# Patient Record
Sex: Male | Born: 1965 | Race: Black or African American | Hispanic: No | Marital: Single | Smoking: Never smoker
Health system: Southern US, Community
[De-identification: ages and names within clinical notes are randomized; demographics above are authoritative.]

## PROBLEM LIST (undated history)

## (undated) DIAGNOSIS — I10 Essential (primary) hypertension: Secondary | ICD-10-CM

## (undated) HISTORY — PX: HERNIA REPAIR: SHX51

---

## 2005-05-01 ENCOUNTER — Encounter: Admission: RE | Admit: 2005-05-01 | Discharge: 2005-05-01 | Payer: Self-pay | Admitting: Occupational Medicine

## 2015-08-18 ENCOUNTER — Encounter: Payer: Self-pay | Admitting: Physical Therapy

## 2015-08-18 ENCOUNTER — Ambulatory Visit: Payer: Self-pay | Attending: Chiropractic Medicine | Admitting: Physical Therapy

## 2015-08-18 DIAGNOSIS — M545 Low back pain, unspecified: Secondary | ICD-10-CM

## 2015-08-18 DIAGNOSIS — M542 Cervicalgia: Secondary | ICD-10-CM | POA: Insufficient documentation
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
## 2015-08-18 NOTE — Patient Instructions (Signed)
AROM: Lateral Neck Flexion   Slowly tilt head toward one shoulder, then the other. Hold each position _3__ seconds. Repeat __10__ times per set. Do _2___ sets per session. Do _2___ sessions per day.  AROM: Neck Flexion   Bend head forward. Hold _3___ seconds. Repeat _10___ times per set. Do _2___ sets per session. Do _2___ sessions per day.  Levator Stretch   Grasp seat or sit on hand on side to be stretched. Turn head toward other side and look down. Use hand on head to gently stretch neck in that position. Hold _20___ seconds. Repeat on other side. Repeat __3__ times. Do __2__ sessions per day.  Elevation / Depression   While slowly inhaling, bring shoulders toward ears. Hold _2__ seconds. Slowly exhale while relaxing shoulders backward and downward. Repeat _10__ times. Do _2__ times per day.SCAPULA: Retraction   Hold cane with both hands. Pinch shoulder blades together. Do not shrug shoulders. Hold ___ seconds. Use___ lb weight on cane. ___ reps per set, ___ sets per day, ___ days per week  NECK TENSION: Assisted Stretch   Reach right arm around head and hold slightly above ear. Gently bring right ear toward right shoulder. Hold position for ___ breaths. Repeat with other arm. Repeat ___ times, alternating arms. Do ___ times per day.  Flexibility: Neck Retraction   Pull head straight back, keeping eyes and jaw level. Repeat ____ times per set. Do ____ sets per session. Do ____ sessions per day.   Knee-to-Chest: with Neck Flexion Stretch (Supine)   Pull left knee to chest, tucking chin and lifting head. Hold __10__ seconds. Relax. Repeat _10___ times per set. Do _2___ sets per session. Do __2__ sessions per day.  Trunk: Knees to Chest   Lie on firm, flat surface. Keep head and shoulders flat on surface. Tuck hands behind knees and pull to chest. Hold _10___ seconds. Repeat __10__ times. Do _2___ sessions per day. CAUTION: Movement should be gentle and  slow.  Caudal Rotation: Hip Roll, Neutral Lordosis - Supine   Lie with knees bent and slightly elevated, feet flat. Tighten stomach, lower knees out to right side, rotating hips and trunk. Keep stomach tight for return. Repeat _10___ times per set. Do __2__ sets per session. Do _2___ sessions per week.  Pelvic Tilt: Anterior - Legs Bent (Supine)   Rotate pelvis up and arch back. Hold ____ seconds. Relax. Repeat ____ times per set. Do ____ sets per session. Do ____ sessions per day.  Piriformis Stretch   Lying on back, pull right knee toward opposite shoulder. Hold __30__ seconds. Repeat _4___ times. Do _2___ sessions per day.  

## 2021-06-20 ENCOUNTER — Other Ambulatory Visit: Payer: Self-pay

## 2021-06-20 ENCOUNTER — Other Ambulatory Visit: Payer: Self-pay | Admitting: Occupational Medicine

## 2021-06-20 ENCOUNTER — Ambulatory Visit: Payer: Self-pay

## 2021-06-20 DIAGNOSIS — Z0000 Encounter for general adult medical examination without abnormal findings: Secondary | ICD-10-CM

## 2021-11-11 ENCOUNTER — Other Ambulatory Visit: Payer: Self-pay

## 2021-11-11 ENCOUNTER — Encounter (HOSPITAL_BASED_OUTPATIENT_CLINIC_OR_DEPARTMENT_OTHER): Payer: Self-pay | Admitting: Emergency Medicine

## 2021-11-11 ENCOUNTER — Emergency Department (HOSPITAL_BASED_OUTPATIENT_CLINIC_OR_DEPARTMENT_OTHER)
Admission: EM | Admit: 2021-11-11 | Discharge: 2021-11-11 | Disposition: A | Discharge status: Home / Self Care | Payer: BC Managed Care – PPO | Attending: Emergency Medicine | Admitting: Emergency Medicine

## 2021-11-11 ENCOUNTER — Emergency Department (HOSPITAL_BASED_OUTPATIENT_CLINIC_OR_DEPARTMENT_OTHER): Payer: BC Managed Care – PPO

## 2021-11-11 DIAGNOSIS — R519 Headache, unspecified: Secondary | ICD-10-CM | POA: Diagnosis present

## 2021-11-11 DIAGNOSIS — I10 Essential (primary) hypertension: Secondary | ICD-10-CM | POA: Diagnosis not present

## 2021-11-11 DIAGNOSIS — J0120 Acute ethmoidal sinusitis, unspecified: Secondary | ICD-10-CM | POA: Insufficient documentation

## 2021-11-11 DIAGNOSIS — Z20822 Contact with and (suspected) exposure to covid-19: Secondary | ICD-10-CM | POA: Insufficient documentation

## 2021-11-11 HISTORY — DX: Essential (primary) hypertension: I10

## 2021-11-11 LAB — CBC WITH DIFFERENTIAL/PLATELET
Abs Immature Granulocytes: 0.02 10*3/uL (ref 0.00–0.07)
Basophils Absolute: 0.1 10*3/uL (ref 0.0–0.1)
Basophils Relative: 1 %
Eosinophils Absolute: 0.1 10*3/uL (ref 0.0–0.5)
Eosinophils Relative: 1 %
HCT: 50.7 % (ref 39.0–52.0)
Hemoglobin: 17.5 g/dL — ABNORMAL HIGH (ref 13.0–17.0)
Immature Granulocytes: 0 %
Lymphocytes Relative: 17 %
Lymphs Abs: 1.8 10*3/uL (ref 0.7–4.0)
MCH: 31.1 pg (ref 26.0–34.0)
MCHC: 34.5 g/dL (ref 30.0–36.0)
MCV: 90.1 fL (ref 80.0–100.0)
Monocytes Absolute: 1.1 10*3/uL — ABNORMAL HIGH (ref 0.1–1.0)
Monocytes Relative: 11 %
Neutro Abs: 7.4 10*3/uL (ref 1.7–7.7)
Neutrophils Relative %: 70 %
Platelets: 321 10*3/uL (ref 150–400)
RBC: 5.63 MIL/uL (ref 4.22–5.81)
RDW: 12.4 % (ref 11.5–15.5)
WBC: 10.5 10*3/uL (ref 4.0–10.5)
nRBC: 0 % (ref 0.0–0.2)

## 2021-11-11 LAB — COMPREHENSIVE METABOLIC PANEL
ALT: 44 U/L (ref 0–44)
AST: 48 U/L — ABNORMAL HIGH (ref 15–41)
Albumin: 4.3 g/dL (ref 3.5–5.0)
Alkaline Phosphatase: 74 U/L (ref 38–126)
Anion gap: 9 (ref 5–15)
BUN: 15 mg/dL (ref 6–20)
CO2: 24 mmol/L (ref 22–32)
Calcium: 9.2 mg/dL (ref 8.9–10.3)
Chloride: 104 mmol/L (ref 98–111)
Creatinine, Ser: 1.51 mg/dL — ABNORMAL HIGH (ref 0.61–1.24)
GFR, Estimated: 55 mL/min — ABNORMAL LOW (ref >60)
Glucose, Bld: 99 mg/dL (ref 70–99)
Potassium: 4.7 mmol/L (ref 3.5–5.1)
Sodium: 137 mmol/L (ref 135–145)
Total Bilirubin: 0.6 mg/dL (ref 0.3–1.2)
Total Protein: 7.9 g/dL (ref 6.5–8.1)

## 2021-11-11 LAB — RESP PANEL BY RT-PCR (FLU A&B, COVID) ARPGX2
Influenza A by PCR: NEGATIVE
Influenza B by PCR: NEGATIVE
SARS Coronavirus 2 by RT PCR: NEGATIVE

## 2021-11-11 LAB — GROUP A STREP BY PCR: Group A Strep by PCR: NOT DETECTED

## 2021-11-11 MED ORDER — AMOXICILLIN-POT CLAVULANATE 875-125 MG PO TABS: 1.0000 | ORAL_TABLET | Freq: Two times a day (BID) | ORAL | 0 refills | Status: AC

## 2021-11-11 MED ORDER — DIPHENHYDRAMINE HCL 50 MG/ML IJ SOLN
25.0000 mg | Freq: Once | INTRAMUSCULAR | Status: AC
  Administered 2021-11-11: 25 mg via INTRAVENOUS
  Filled 2021-11-11: qty 1

## 2021-11-11 MED ORDER — PROCHLORPERAZINE EDISYLATE 10 MG/2ML IJ SOLN
10.0000 mg | Freq: Once | INTRAMUSCULAR | Status: AC
  Administered 2021-11-11: 10 mg via INTRAVENOUS
  Filled 2021-11-11: qty 2

## 2021-11-11 NOTE — ED Notes (Signed)
Patient transported to CT 

## 2021-11-11 NOTE — ED Notes (Signed)
Pt reports feeling "antsy", EDP updated.

## 2021-11-11 NOTE — ED Triage Notes (Signed)
Pt arrives pov with c/o HA since 12pm. Hx of htn. Denies fever, n/v, endorses chills. Also reports sore throat

## 2021-11-11 NOTE — ED Notes (Signed)
ED Provider at bedside. 

## 2021-11-11 NOTE — ED Provider Notes (Signed)
[facility] EMERGENCY DEPARTMENT Provider Note   CSN: [id number] Arrival date & time: 11/11/21  4098     History  Chief Complaint  Patient presents with   Headache    Ross Serrano is a 56 y.o. male.  HPI  56 year old male with history of hypertension presents with concern for congestion, sore throat and headache.  Reports that his nasal congestion started about 1 week ago.  Yesterday developed a sore throat.  He went to a casino last night and around midnight began to develop a headache.  Reports that the headache slowly worsened since that time, and is now an 8-9 out of 10 in severity.  He denies associated numbness, weakness, visual changes, dizziness, facial droop, difficulty talking or walking.  Denies fever but reports he has had chills.  The headache is an aching pain located at the front.  He thinks it is slightly worsened with bright lights.  Had not yet taken his antihypertensive medication today.  Reports he did have some alcohol last night, denies reg drug use.  Denies smoking, other drug use.  No cough, shortness of breath, chest pain, fever.    Past Medical History:  Diagnosis Date   Hypertension     Home Medications Prior to Admission medications   Medication Sig Start Date End Date Taking? Authorizing Provider  amoxicillin-clavulanate (AUGMENTIN) 875-125 MG tablet Take 1 tablet by mouth every 12 (twelve) hours for 10 days. 11/11/21 11/21/21 Yes Alvira Monday, MD      Allergies    Patient has no allergy information on record.    Review of Systems   Review of Systems SEE ABOVE  Physical Exam Updated Vital Signs BP (!) 150/105    Pulse 100    Temp 99.2 F (37.3 C) (Oral)    Resp 19    Ht 5\' 8"  (1.727 m)    Wt 90.7 kg    SpO2 97%    BMI 30.41 kg/m  Physical Exam Constitutional:      General: He is not in acute distress.    Appearance: Normal appearance. He is not ill-appearing.  HENT:     Head: Normocephalic and atraumatic.  Eyes:      General: No visual field deficit.    Extraocular Movements: Extraocular movements intact.     Conjunctiva/sclera: Conjunctivae normal.     Pupils: Pupils are equal, round, and reactive to light.  Cardiovascular:     Rate and Rhythm: Normal rate and regular rhythm.     Pulses: Normal pulses.  Pulmonary:     Effort: Pulmonary effort is normal. No respiratory distress.  Musculoskeletal:        General: No swelling or tenderness.     Cervical back: Normal range of motion.  Skin:    General: Skin is warm and dry.     Findings: No erythema or rash.  Neurological:     General: No focal deficit present.     Mental Status: He is alert and oriented to person, place, and time.     GCS: GCS eye subscore is 4. GCS verbal subscore is 5. GCS motor subscore is 6.     Cranial Nerves: No cranial nerve deficit, dysarthria or facial asymmetry.     Sensory: No sensory deficit.     Motor: No weakness or tremor.     Coordination: Coordination normal. Finger-Nose-Finger Test normal.    ED Results / Procedures / Treatments   Labs (all labs ordered are listed, but only abnormal results  are displayed) Labs Reviewed  CBC WITH DIFFERENTIAL/PLATELET - Abnormal; Notable for the following components:      Result Value   Hemoglobin 17.5 (*)    Monocytes Absolute 1.1 (*)    All other components within normal limits  COMPREHENSIVE METABOLIC PANEL - Abnormal; Notable for the following components:   Creatinine, Ser 1.51 (*)    AST 48 (*)    GFR, Estimated 55 (*)    All other components within normal limits  RESP PANEL BY RT-PCR (FLU A&B, COVID) [id number]  GROUP A STREP BY PCR    EKG EKG Interpretation  Date/Time:  Saturday November 11 2021 08:06:11 EST Ventricular Rate:  92 PR Interval:  161 QRS Duration: 82 QT Interval:  341 QTC Calculation: 422 R Axis:   5 Text Interpretation: Sinus rhythm No previous ECGs available Confirmed by Alvira Monday ([id number]) on 11/11/2021 8:50:43 AM  Radiology CT Head Wo  Contrast  Result Date: 11/11/2021 CLINICAL DATA:  Headache. EXAM: CT HEAD WITHOUT CONTRAST TECHNIQUE: Contiguous axial images were obtained from the base of the skull through the vertex without intravenous contrast. COMPARISON:  None. FINDINGS: Brain: No evidence of acute infarction, hemorrhage, hydrocephalus, extra-axial collection or mass lesion/mass effect. Vascular: No hyperdense vessel or unexpected calcification. Skull: Normal. Negative for fracture or focal lesion. Sinuses/Orbits: Globes and orbits are unremarkable. Mild left and minimal right ethmoid and maxillary sinus mucosal thickening. Mucous retention cyst in the floor of the right maxillary sinus. Other: None. IMPRESSION: 1. No intracranial abnormality. 2. Ethmoid and maxillary sinus mucosal thickening. Electronically Signed   By: Amie Portland M.D.   On: 11/11/2021 08:41    Procedures Procedures    Medications Ordered in ED Medications  prochlorperazine (COMPAZINE) injection 10 mg (10 mg Intravenous Given 11/11/21 0848)  diphenhydrAMINE (BENADRYL) injection 25 mg (25 mg Intravenous Given 11/11/21 0848)    ED Course/ Medical Decision Making/ A&P                           Medical Decision Making   56 year old male with history of hypertension presents with concern for congestion, sore throat and headache.  CT head completed to evaluate for ICH given hypertension and severity of headache shows ethmoid and maxillary sinus mucosal thickening.    Labs with mildly elevated hgb, CMP without clinically significant abnormalities.   COVID/flu swab negative. Strep negative.  Suspect likely bacterial sinusitis as etiology of congestion, headache, chills.  GIven rx for augmentin. Headache improved with headache cocktail.  Patient discharged in stable condition with understanding of reasons to return.         Final Clinical Impression(s) / ED Diagnoses Final diagnoses:  Acute non-recurrent ethmoidal sinusitis  Nonintractable  headache, unspecified chronicity pattern, unspecified headache type    Rx / DC Orders ED Discharge Orders          Ordered    amoxicillin-clavulanate (AUGMENTIN) 875-125 MG tablet  Every 12 hours        11/11/21 0947              Alvira Monday, MD 11/11/21 2203

## 2023-06-28 IMAGING — CT CT HEAD W/O CM
4 series · 16 of 47 positions shown, 18 images · non-contrast
Comparison: None.

CLINICAL DATA: Headache.

EXAM:
CT HEAD WITHOUT CONTRAST
TECHNIQUE: Contiguous axial images were obtained from the base of the skull
through the vertex without intravenous contrast.

[Series 2: head wo · axial · 0.47mm/px · z∈[-216,-96]mm · 7 of 32 slices shown, 9 images]
[im 4/32  brain]
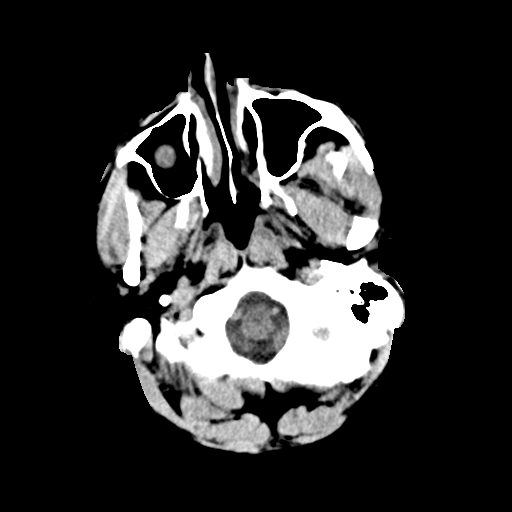
[im 4/32  bone]
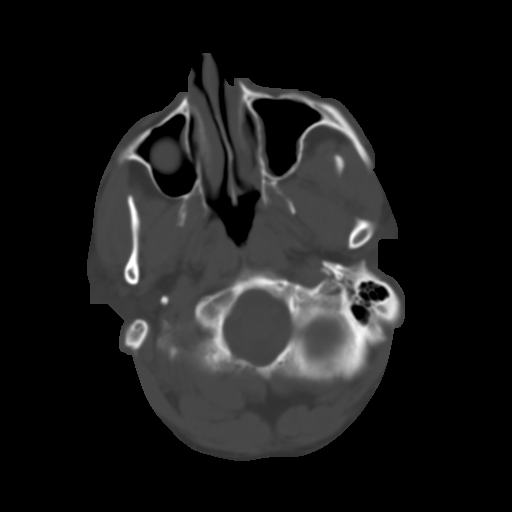
[im 8/32  brain]
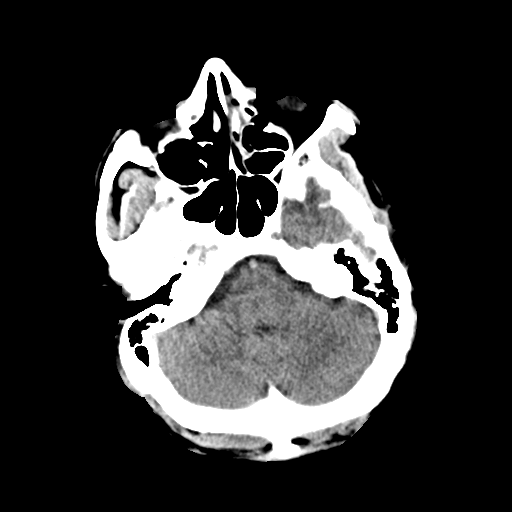
[im 12/32  brain]
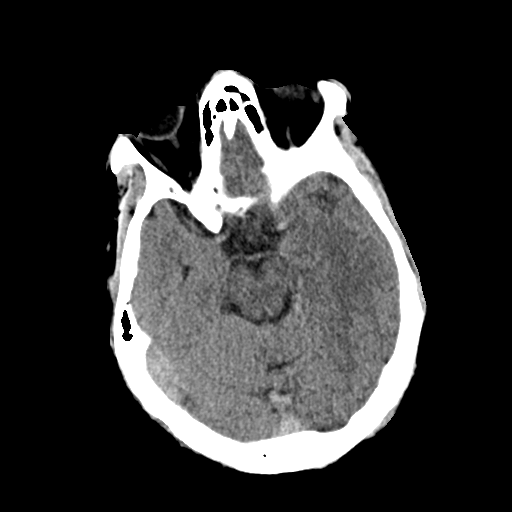
[im 16/32  brain]
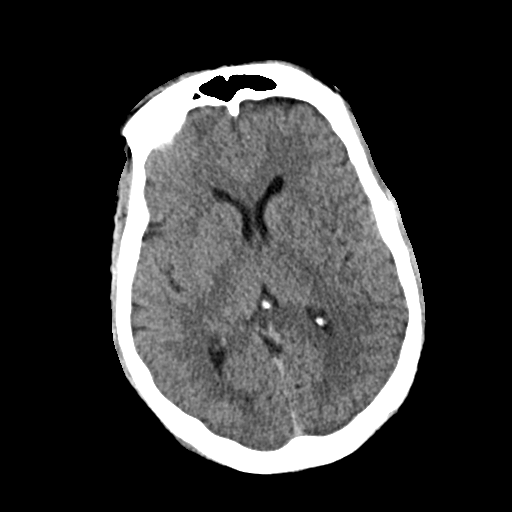
[im 20/32  brain]
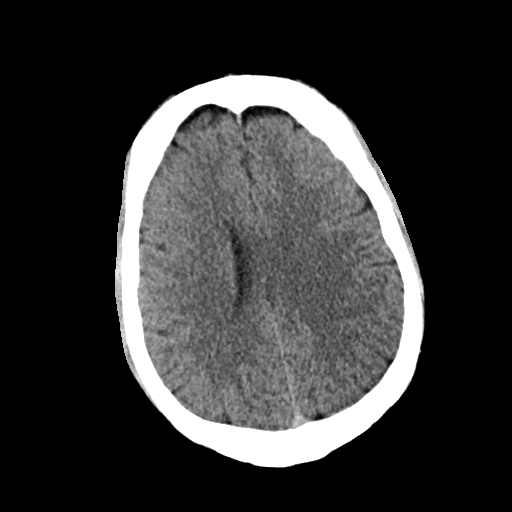
[im 20/32  bone]
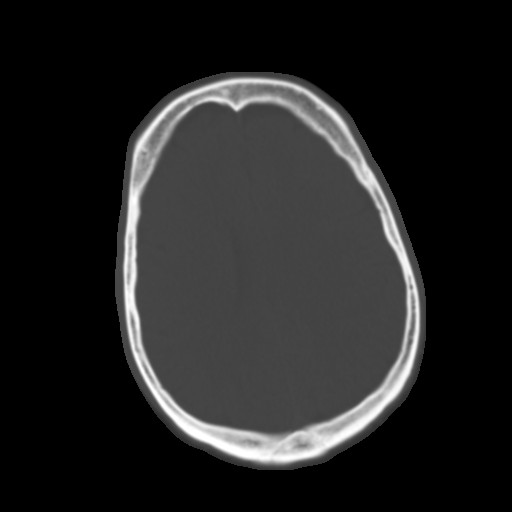
[im 24/32  brain]
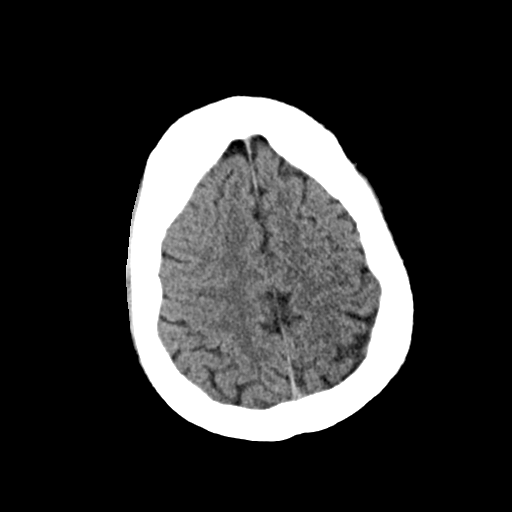
[im 28/32  brain]
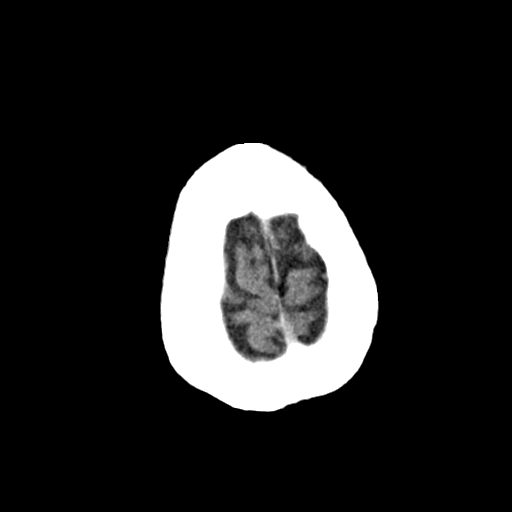

[Series 3: head bone · axial · 0.47mm/px · z∈[-216,-184]mm · 3 of 80 slices shown]
[im 8/80  bone]
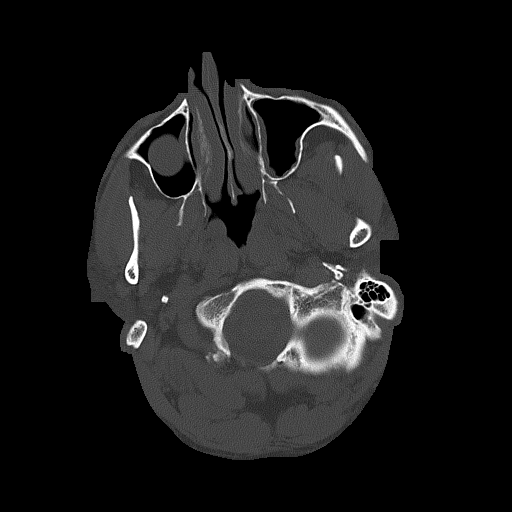
[im 16/80  bone]
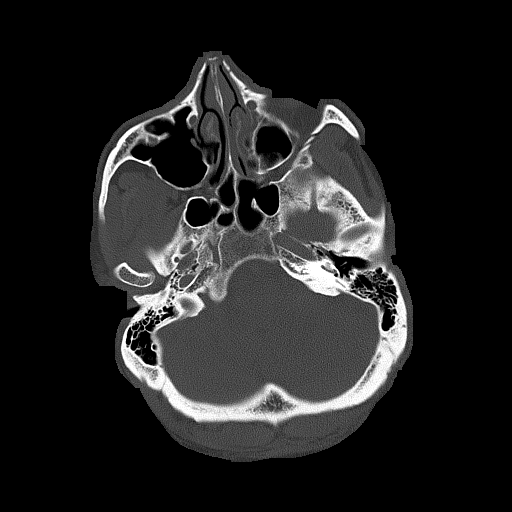
[im 24/80  bone]
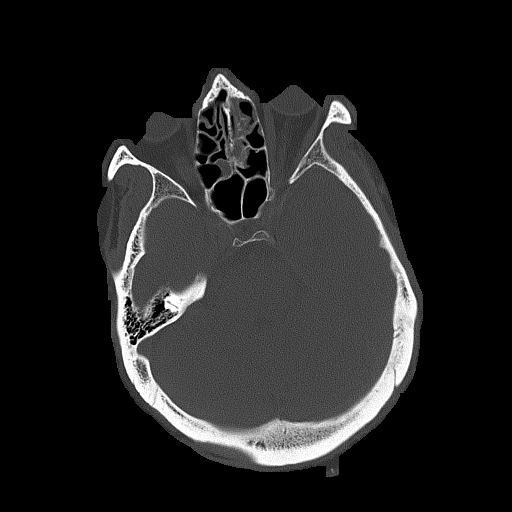

[Series 4: coronal soft · coronal · 0.31mm/px · 3 of 70 slices shown]
[im 24/70  brain]
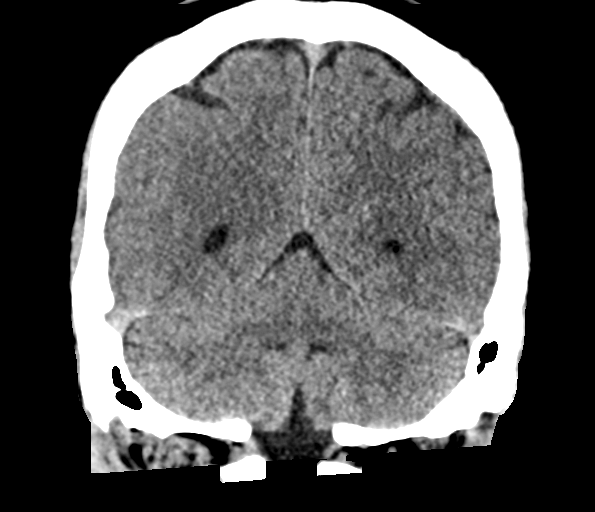
[im 31/70  brain]
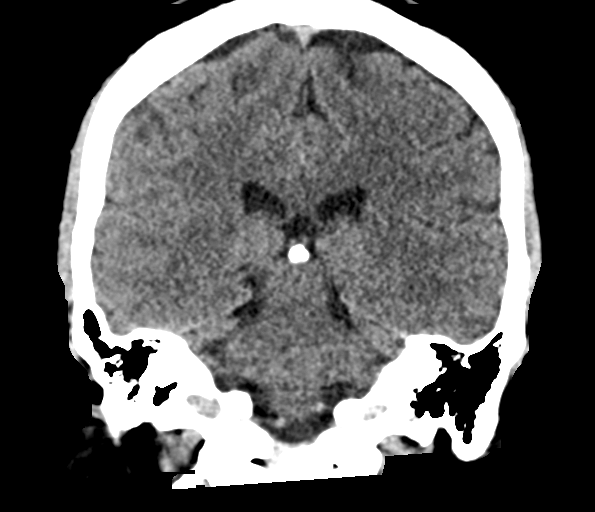
[im 39/70  brain]
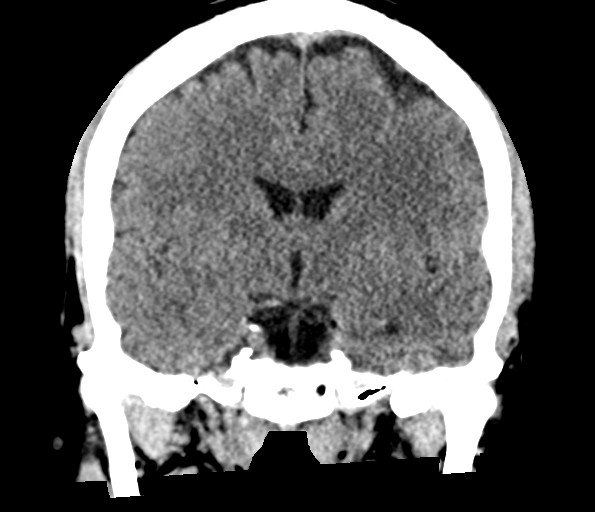

[Series 5: sag soft · sagittal · 0.34mm/px · 3 of 57 slices shown]
[im 19/57  brain]
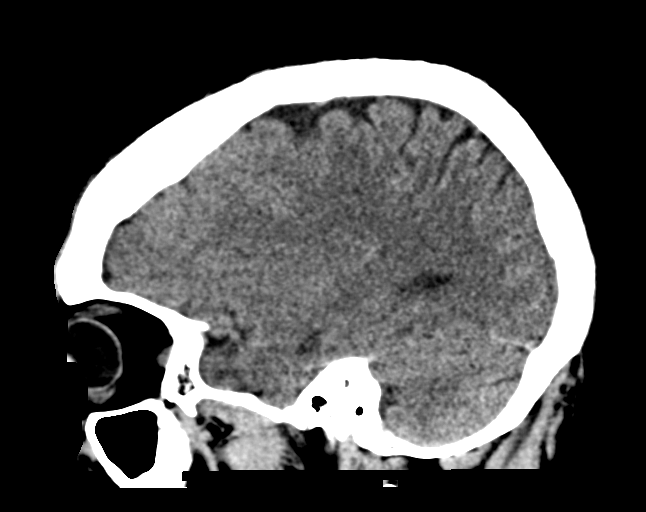
[im 29/57  brain]
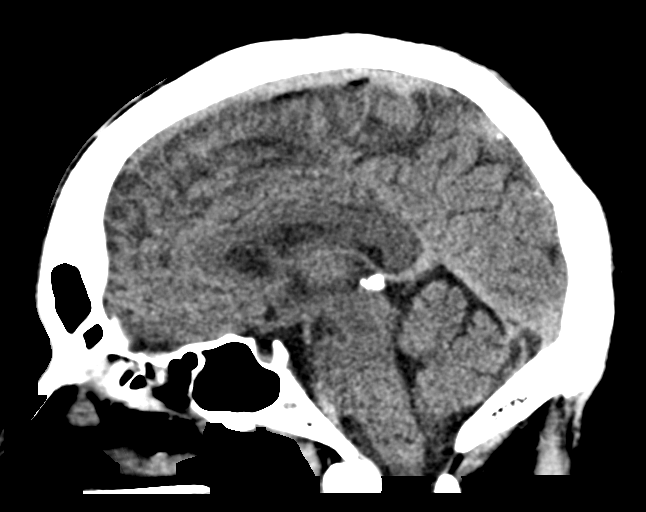
[im 38/57  brain]
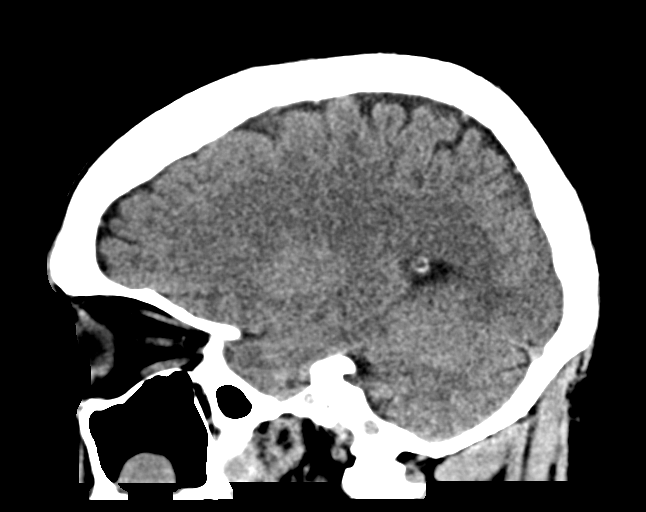

[16 of 47 positions shown; findings below may reference images not displayed]

FINDINGS: Brain: No evidence of acute infarction, hemorrhage, hydrocephalus,
extra-axial collection or mass lesion/mass effect.

Vascular: No hyperdense vessel or unexpected calcification.

Skull: Normal. Negative for fracture or focal lesion.

Sinuses/Orbits: Globes and orbits are unremarkable. Mild left and
minimal right ethmoid and maxillary sinus mucosal thickening. Mucous
retention cyst in the floor of the right maxillary sinus.

Other: None.
IMPRESSION: 1. No intracranial abnormality.
2. Ethmoid and maxillary sinus mucosal thickening.

## 2023-09-03 ENCOUNTER — Other Ambulatory Visit: Payer: Self-pay

## 2023-09-03 ENCOUNTER — Emergency Department (HOSPITAL_BASED_OUTPATIENT_CLINIC_OR_DEPARTMENT_OTHER)
Admission: EM | Admit: 2023-09-03 | Discharge: 2023-09-03 | Disposition: A | Discharge status: Home / Self Care | Payer: BC Managed Care – PPO | Attending: Emergency Medicine | Admitting: Emergency Medicine

## 2023-09-03 DIAGNOSIS — I10 Essential (primary) hypertension: Secondary | ICD-10-CM | POA: Diagnosis not present

## 2023-09-03 DIAGNOSIS — M542 Cervicalgia: Secondary | ICD-10-CM | POA: Insufficient documentation

## 2023-09-03 DIAGNOSIS — Z79899 Other long term (current) drug therapy: Secondary | ICD-10-CM | POA: Insufficient documentation

## 2023-09-03 DIAGNOSIS — M62838 Other muscle spasm: Secondary | ICD-10-CM | POA: Diagnosis present

## 2023-09-03 MED ORDER — DICLOFENAC SODIUM 50 MG PO TBEC
50.0000 mg | DELAYED_RELEASE_TABLET | Freq: Three times a day (TID) | ORAL | 0 refills | Status: AC
Start: 2023-09-03 — End: ?

## 2023-09-03 MED ORDER — CYCLOBENZAPRINE HCL 10 MG PO TABS: 10.0000 mg | ORAL_TABLET | Freq: Two times a day (BID) | ORAL | 0 refills | Status: AC | PRN

## 2023-09-03 NOTE — ED Triage Notes (Addendum)
Pt POV steady gait, c/o R neck and shoulder pain x5 days, woke up to pain.  Denies injury. Worse with movement, and/or at night.   Pt seen 'specialist' a month ago for same.

## 2023-09-03 NOTE — Discharge Instructions (Addendum)
Thank you for allowing [place] to be a part of your care today.  Your evaluated in the ED for neck and shoulder pain.  I suspect your symptoms are coming from overuse or spasm of the trapezius muscle.  I am sending you home on a muscle relaxant and prescription strength NSAID.  DO NOT consume alcohol, drive, or operate heavy machinery while taking the flexeril as this medication can be sedating.   Use a heating pad 3 times per day for 20 minutes at a time.  Always use a barrier between your skin and heat sources to prevent skin injury.  I recommend doing gentle stretches and trying to avoid heavy lifting or repetitive motions at work whenever possible as this may continue to exacerbate your pain.  Follow-up with your primary care doctor if your symptoms do not begin to improve with time and treatment.  Return to the ED if develop sudden worsening of your symptoms or if you have new concerns.

## 2023-09-03 NOTE — ED Provider Notes (Signed)
[facility] EMERGENCY DEPARTMENT AT [facility] Provider Note   CSN: [id number] Arrival date & time: 09/03/23  1043     History  Chief Complaint  Patient presents with   Shoulder Pain    Ross Serrano is a 57 y.o. male with past medical history significant for hypertension presents to the ED complaining of right neck and shoulder pain for the past 5 days.  Patient states he woke up with pain.  He denies any known injury, but reports that he does have to do a lot of reaching and pulling at work.  He states that it is worse with movement and whenever he tries to lay down to go to sleep at night.  Patient reports he was seen by an orthopedic specialist a month ago, but they focused mainly on his rotator cuff.  He states he feels like his neck and shoulder very tight.  Denies numbness or weakness in the extremities.        Home Medications Prior to Admission medications   Medication Sig Start Date End Date Taking? Authorizing Provider  cyclobenzaprine (FLEXERIL) 10 MG tablet Take 1 tablet (10 mg total) by mouth 2 (two) times daily as needed for muscle spasms. 09/03/23  Yes Iyannah Blake R, PA-C  diclofenac (VOLTAREN) 50 MG EC tablet Take 1 tablet (50 mg total) by mouth 3 (three) times daily. 09/03/23  Yes Cameron Schwinn R, PA-C      Allergies    Patient has no known allergies.    Review of Systems   Review of Systems  Musculoskeletal:  Positive for myalgias and neck pain. Negative for arthralgias and neck stiffness.  Neurological:  Negative for weakness and numbness.    Physical Exam Updated Vital Signs BP (!) 159/96 (BP Location: Right Arm)   Pulse 64   Temp 98.5 F (36.9 C)   Resp 17   Ht 5\' 9"  (1.753 m)   Wt 90.7 kg   SpO2 100%   BMI 29.53 kg/m  Physical Exam Vitals and nursing note reviewed.  Constitutional:      General: He is not in acute distress.    Appearance: Normal appearance. He is not ill-appearing or diaphoretic.  Cardiovascular:     Rate  and Rhythm: Normal rate and regular rhythm.  Pulmonary:     Effort: Pulmonary effort is normal.  Musculoskeletal:     Right shoulder: Normal. No tenderness or bony tenderness. Normal range of motion.     Cervical back: Tenderness (R paracervical) present. No torticollis or bony tenderness. Pain with movement (lateral rotation) present. Normal range of motion.     Thoracic back: Normal.       Back:  Skin:    General: Skin is warm and dry.     Capillary Refill: Capillary refill takes less than 2 seconds.  Neurological:     Mental Status: He is alert. Mental status is at baseline.  Psychiatric:        Mood and Affect: Mood normal.        Behavior: Behavior normal.     ED Results / Procedures / Treatments   Labs (all labs ordered are listed, but only abnormal results are displayed) Labs Reviewed - No data to display  EKG None  Radiology No results found.  Procedures Procedures    Medications Ordered in ED Medications - No data to display  ED Course/ Medical Decision Making/ A&P  Medical Decision Making  This patient presents to the ED with chief complaint(s) of neck and shoulder pain. The complaint involves an extensive differential diagnosis and also carries with it a high risk of complications and morbidity.    The differential diagnosis includes muscle spasm, cervical strain   Initial Assessment:   Exam significant for overall well-appearing patient is not in acute distress.  No gross deformities identified on exam.  He does have tenderness to palpation along the superior trapezius muscle.  He does have somewhat increased pain with lateral rotation of the neck.  Normal ROM of the cervical spine.  No midline tenderness.  No bony tenderness of the shoulder.  He is moving his upper extremities appropriately.  Disposition:   Will send patient home on muscle relaxant and prescription strength NSAID to treat trapezius muscle spasm acute neck  pain.  Suspect patient has a strained muscle which may be related to work activities.  The patient has been appropriately medically screened and/or stabilized in the ED. I have low suspicion for any other emergent medical condition which would require further screening, evaluation or treatment in the ED or require inpatient management. At time of discharge the patient is hemodynamically stable and in no acute distress. I have discussed work-up results and diagnosis with patient and answered all questions. Patient is agreeable with discharge plan. We discussed strict return precautions for returning to the emergency department and they verbalized understanding.             Final Clinical Impression(s) / ED Diagnoses Final diagnoses:  Trapezius muscle spasm  Neck pain on right side    Rx / DC Orders ED Discharge Orders          Ordered    cyclobenzaprine (FLEXERIL) 10 MG tablet  2 times daily PRN        09/03/23 1204    diclofenac (VOLTAREN) 50 MG EC tablet  3 times daily        09/03/23 [address] PA-C 09/03/23 1212    Ernie Avena, MD 09/03/23 1325
